# Patient Record
Sex: Male | Born: 1975 | Race: White | Hispanic: No | Marital: Single | Smoking: Current every day smoker
Health system: Southern US, Community
[De-identification: ages and names within clinical notes are randomized; demographics above are authoritative.]

## PROBLEM LIST (undated history)

## (undated) HISTORY — PX: FRACTURE SURGERY: SHX138

## (undated) HISTORY — PX: CHOLECYSTECTOMY: SHX55

---

## 2015-02-25 ENCOUNTER — Emergency Department (HOSPITAL_BASED_OUTPATIENT_CLINIC_OR_DEPARTMENT_OTHER)
Admission: EM | Admit: 2015-02-25 | Discharge: 2015-02-25 | Disposition: A | Discharge status: Home / Self Care | Payer: BLUE CROSS/BLUE SHIELD | Attending: Emergency Medicine | Admitting: Emergency Medicine

## 2015-02-25 ENCOUNTER — Encounter (HOSPITAL_BASED_OUTPATIENT_CLINIC_OR_DEPARTMENT_OTHER): Payer: Self-pay

## 2015-02-25 ENCOUNTER — Emergency Department (HOSPITAL_BASED_OUTPATIENT_CLINIC_OR_DEPARTMENT_OTHER): Payer: BLUE CROSS/BLUE SHIELD

## 2015-02-25 DIAGNOSIS — R110 Nausea: Secondary | ICD-10-CM | POA: Diagnosis not present

## 2015-02-25 DIAGNOSIS — R51 Headache: Secondary | ICD-10-CM | POA: Diagnosis not present

## 2015-02-25 DIAGNOSIS — R42 Dizziness and giddiness: Secondary | ICD-10-CM | POA: Insufficient documentation

## 2015-02-25 DIAGNOSIS — Z720 Tobacco use: Secondary | ICD-10-CM | POA: Insufficient documentation

## 2015-02-25 DIAGNOSIS — R519 Headache, unspecified: Secondary | ICD-10-CM

## 2015-02-25 LAB — COMPREHENSIVE METABOLIC PANEL
ALT: 47 U/L (ref 0–53)
ANION GAP: 7 (ref 5–15)
AST: 27 U/L (ref 0–37)
Albumin: 4.2 g/dL (ref 3.5–5.2)
Alkaline Phosphatase: 61 U/L (ref 39–117)
BUN: 14 mg/dL (ref 6–23)
CO2: 23 mmol/L (ref 19–32)
CREATININE: 1.15 mg/dL (ref 0.50–1.35)
Calcium: 9 mg/dL (ref 8.4–10.5)
Chloride: 108 mmol/L (ref 96–112)
GFR calc Af Amer: >90 mL/min (ref >90)
GFR calc non Af Amer: 80 mL/min — ABNORMAL LOW (ref >90)
Glucose, Bld: 112 mg/dL — ABNORMAL HIGH (ref 70–99)
Potassium: 3.9 mmol/L (ref 3.5–5.1)
SODIUM: 138 mmol/L (ref 135–145)
Total Bilirubin: 0.6 mg/dL (ref 0.3–1.2)
Total Protein: 7.1 g/dL (ref 6.0–8.3)

## 2015-02-25 LAB — CBC WITH DIFFERENTIAL/PLATELET
Basophils Absolute: 0 10*3/uL (ref 0.0–0.1)
Basophils Relative: 0 % (ref 0–1)
EOS PCT: 2 % (ref 0–5)
Eosinophils Absolute: 0.1 10*3/uL (ref 0.0–0.7)
HEMATOCRIT: 46.9 % (ref 39.0–52.0)
Hemoglobin: 16.6 g/dL (ref 13.0–17.0)
Lymphocytes Relative: 30 % (ref 12–46)
Lymphs Abs: 2 10*3/uL (ref 0.7–4.0)
MCH: 32 pg (ref 26.0–34.0)
MCHC: 35.4 g/dL (ref 30.0–36.0)
MCV: 90.4 fL (ref 78.0–100.0)
Monocytes Absolute: 0.6 10*3/uL (ref 0.1–1.0)
Monocytes Relative: 9 % (ref 3–12)
NEUTROS ABS: 3.9 10*3/uL (ref 1.7–7.7)
Neutrophils Relative %: 59 % (ref 43–77)
Platelets: 151 10*3/uL (ref 150–400)
RBC: 5.19 MIL/uL (ref 4.22–5.81)
RDW: 12.7 % (ref 11.5–15.5)
WBC: 6.6 10*3/uL (ref 4.0–10.5)

## 2015-02-25 MED ORDER — MECLIZINE HCL 25 MG PO TABS: 25.0000 mg | ORAL_TABLET | Freq: Four times a day (QID) | ORAL | Status: AC | PRN

## 2015-02-25 MED ORDER — IOHEXOL 350 MG/ML SOLN
100.0000 mL | Freq: Once | INTRAVENOUS | Status: AC | PRN
  Administered 2015-02-25: 100 mL via INTRAVENOUS

## 2015-02-25 MED ORDER — SODIUM CHLORIDE 0.9 % IV BOLUS (SEPSIS)
1000.0000 mL | Freq: Once | INTRAVENOUS | Status: AC
  Administered 2015-02-25: 1000 mL via INTRAVENOUS

## 2015-02-25 NOTE — ED Notes (Signed)
Pt returned from CT °

## 2015-02-25 NOTE — ED Notes (Signed)
MD at bedside. 

## 2015-02-25 NOTE — ED Notes (Signed)
Patient transported to CT 

## 2015-02-25 NOTE — ED Notes (Signed)
MD at bedside, discussing results with patient. 

## 2015-02-25 NOTE — ED Notes (Signed)
Reports dizziness off and on x 2 months. Reports intermittently happening before. Sts now it happens a few times a day. Reports "swimmy head" feeling. Sts occurred this morning, while he was driving. Reports quick movements make him dizzy and feel like he is spinning.

## 2015-02-25 NOTE — ED Provider Notes (Signed)
CSN: [id number]     Arrival date & time 02/25/15  1025 History   First MD Initiated Contact with Patient 02/25/15 1039     Chief Complaint  Patient presents with  . Dizziness     (Consider location/radiation/quality/duration/timing/severity/associated sxs/prior Treatment) HPI  39 year old male presents for evaluation of dizziness. He states that last night and today especially he had brief room spinning sensation. The first time it started when he was picking up something first child under the couch and when he stood up he felt acute onset of dizziness. When he rested the seem to go away. While he was driving he turned his head quickly and noticed that everything was spinning immediately after and he had to pull off the side of the road. When this occurs it feels like he's drunk and can't walk straight. Currently he is asymptomatic. He states he has not followed up with a doctor in several years and does not know of any medical problems. Patient states that this is happened infrequently in the past but over last 3 weeks is becoming more more frequent, almost daily. Mostly notices it when he lifts his head straight up. Denies any headache but states that for "a while" he's been having daily nausea in the morning that seems to progressively get better.  History reviewed. No pertinent past medical history. Past Surgical History  Procedure Laterality Date  . Fracture surgery    . Cholecystectomy     No family history on file. History  Substance Use Topics  . Smoking status: Current Every Day Smoker -- 0.50 packs/day    Types: Cigarettes  . Smokeless tobacco: Not on file  . Alcohol Use: No    Review of Systems  Constitutional: Negative for fever.  Eyes: Negative for photophobia and visual disturbance.  Gastrointestinal: Positive for nausea. Negative for vomiting.  Neurological: Positive for dizziness. Negative for weakness, numbness and headaches.  All other systems reviewed and are  negative.     Allergies  Review of patient's allergies indicates no known allergies.  Home Medications   Prior to Admission medications   Not on File   BP 141/90 mmHg  Pulse 84  Temp(Src) 98.3 F (36.8 C) (Oral)  Resp 18  Ht  (1.803 m)  Wt 213 lb 12.8 oz (96.979 kg)  BMI 29.83 kg/m2  SpO2 99% Physical Exam  Constitutional: He is oriented to person, place, and time. He appears well-developed and well-nourished.  HENT:  Head: Normocephalic and atraumatic.  Right Ear: External ear normal.  Left Ear: External ear normal.  Nose: Nose normal.  Mouth/Throat: Oropharynx is clear and moist.  Eyes: EOM are normal. Pupils are equal, round, and reactive to light. Right eye exhibits no discharge. Left eye exhibits no discharge.  No nystagmus  Neck: Normal range of motion. Neck supple.  Cardiovascular: Normal rate, regular rhythm, normal heart sounds and intact distal pulses.   Pulmonary/Chest: Effort normal and breath sounds normal.  Abdominal: Soft. He exhibits no distension. There is no tenderness.  Musculoskeletal: He exhibits no edema.  Neurological: He is alert and oriented to person, place, and time.  CN 2-12 grossly intact. 5/5 strength in all 4 extremities. Grossly normal sensation. Normal finger to nose. Normal RAM. Normal gait  Skin: Skin is warm and dry.  Nursing note and vitals reviewed.   ED Course  Procedures (including critical care time) Labs Review Labs Reviewed  COMPREHENSIVE METABOLIC PANEL - Abnormal; Notable for the following:    Glucose, Bld 112 (*)  GFR calc non Af Amer 80 (*)    All other components within normal limits  CBC WITH DIFFERENTIAL/PLATELET    Imaging Review Ct Angio Head W/cm &/or Wo Cm  02/25/2015   CLINICAL DATA:  39 year old male with headache, intermittent dizziness, nausea. Initial encounter. Questioned abnormality of the left ICA terminus on noncontrast head CT today.  EXAM: CT ANGIOGRAPHY HEAD  TECHNIQUE: Multidetector CT  imaging of the head was performed using the standard protocol during bolus administration of intravenous contrast. Multiplanar CT image reconstructions and MIPs were obtained to evaluate the vascular anatomy.  CONTRAST:  OMNIPAQUE IOHEXOL 350 MG/ML SOLN  COMPARISON:  Head CT without contrast 1148 hours today.  FINDINGS: Posterior circulation: Codominant distal vertebral arteries. Tortuous vertebrobasilar junction. PICA origins appear within normal limits. Tortuous basilar artery without stenosis. Normal SCA and PCA origins, tortuous proximal right PCA. Posterior communicating arteries are diminutive or absent. Bilateral PCA branches are within normal limits (early P2 segment branching on the right).  Anterior circulation: Suspect tortuous cervical right ICA just below the skullbase. Otherwise negative distal cervical ICAs. Both ICA siphons are patent. No siphon atherosclerosis or stenosis. Normal ophthalmic artery origins. Normal carotid termini. Normal MCA and ACA origins. Normal anterior communicating artery and bilateral ACA branches. Left MCA branches are within normal limits. Right MCA branches are within normal limits.  Venous sinuses: Normal.  Anatomic variants: Early branching of the right PCA P2 segment.  Delayed phase:Negative. No abnormal enhancement identified. Stable and normal gray-white matter differentiation.  Left maxillary sinus opacification/ mucous retention cyst.  IMPRESSION: Essentially normal intracranial CTA; occasional tortuosity of vessels. The left ICA terminus is normal.   Electronically Signed   By: Odessa Fleming M.D.   On: 02/25/2015 13:19   Ct Head Wo Contrast  02/25/2015   CLINICAL DATA:  Initial evaluation intermittent dizziness and nausea for several months becoming more frequent  EXAM: CT HEAD WITHOUT CONTRAST  TECHNIQUE: Contiguous axial images were obtained from the base of the skull through the vertex without intravenous contrast.  COMPARISON:  None.  FINDINGS: 2 cm mucous  retention cyst left maxillary sinus. Remainder of the visualized sinuses clear. Calvarium intact.  No evidence of mass or infarct. No hydrocephalus. No hemorrhage or extra-axial fluid. Mild prominence of the left supra clinoid intracranial internal carotid artery to 4 mm.  IMPRESSION: 1. Mild prominence left intracranial supra clinoid carotid artery. Cannot exclude aneurysm. Further evaluation with MRI/MRA recommended. 2. Left mucous retention cyst in the maxillary sinus   Electronically Signed   By: Esperanza Heir M.D.   On: 02/25/2015 12:13     EKG Interpretation   Date/Time:  Thursday February 25 2015 11:18:04 EDT Ventricular Rate:  72 PR Interval:  154 QRS Duration: 90 QT Interval:  368 QTC Calculation: 402 R Axis:   70 Text Interpretation:  Normal sinus rhythm Normal ECG No old tracing to  compare Confirmed by Vitoria Conyer  MD, Quilla Freeze ([id number]) on 02/25/2015 11:39:55 AM      MDM   Final diagnoses:  Headache  Vertigo    I believe the patient is experiencing vertigo, likely BPPV. Patient has no symptoms at this time. Has a normal neurologic exam, including cerebellar testing and gait. Patient has no nystagmus. Given his daily nausea in the morning and these persistent symptoms for months without any primary care, CT was obtained. Shows a possible abnormality concerning for possible aneurysm. Discussed with the radiologist who notes that CTA should be adequate to rule out aneurysm but would  not begin up for stroke. Given his age and proximal symptoms I'm not as concerned for stroke. CTA shows no aneurysm. I feel this point this is a benign etiology, discussed Epley maneuver as well as Antivert as needed. I did recommend he follow-up with a PCP to establish primary care and general health maintenance as well.    Pricilla LovelessScott Aariona Momon, MD 02/25/15 [phone]

## 2016-01-20 IMAGING — CT CT ANGIO HEAD
3 of 8 series · 18 of 47 positions shown · IV contrast (APPLIED)
Comparison: Head CT without contrast 6699 hours today.

CLINICAL DATA: 38-year-old male with headache, intermittent
dizziness, nausea. Initial encounter. Questioned abnormality of the
left ICA terminus on noncontrast head CT today.

EXAM:
CT ANGIOGRAPHY HEAD
TECHNIQUE: Multidetector CT imaging of the head was performed using the
standard protocol during bolus administration of intravenous
contrast. Multiplanar CT image reconstructions and MIPs were
obtained to evaluate the vascular anatomy.
CONTRAST:  100mL OMNIPAQUE IOHEXOL 350 MG/ML SOLN

[Series 9: cor thin · coronal · 0.34mm/px · 3 of 196 slices shown]
[im 56/196  brain]
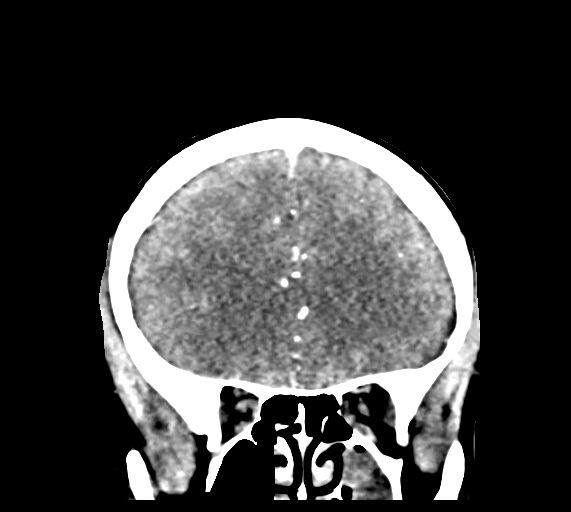
[im 84/196  brain]
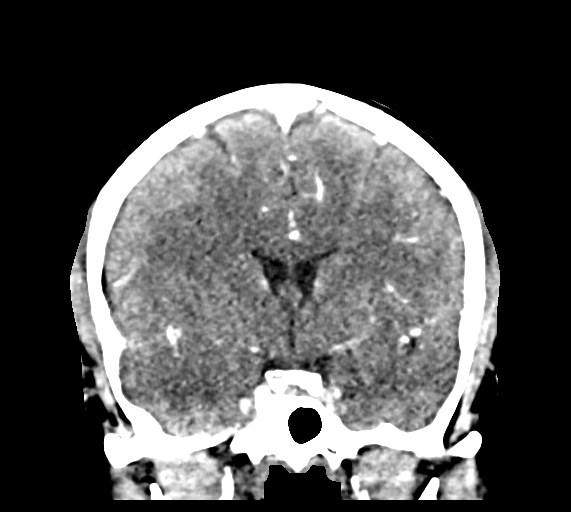
[im 112/196  brain]
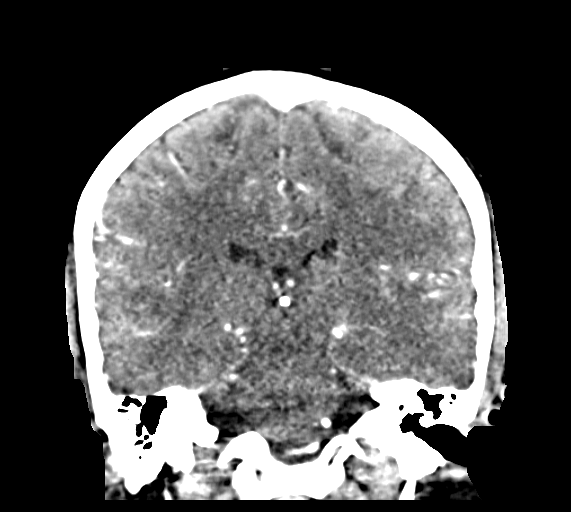

[Series 10: sag thin · sagittal · 0.36mm/px · 3 of 160 slices shown]
[im 32/160  brain]
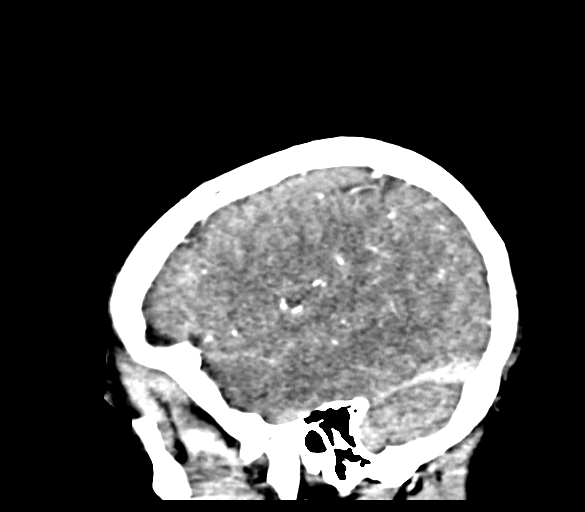
[im 64/160  brain]
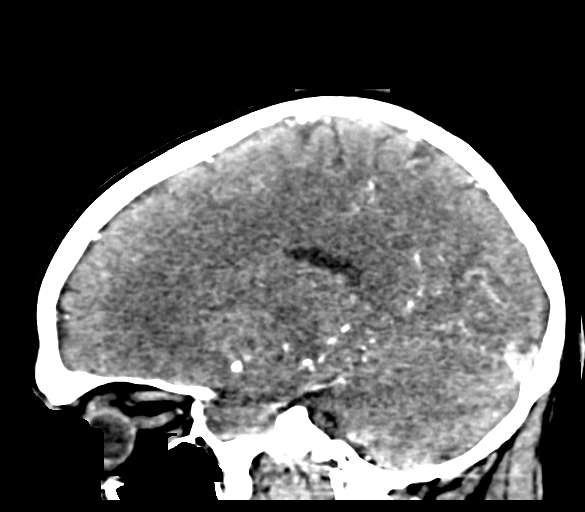
[im 96/160  brain]
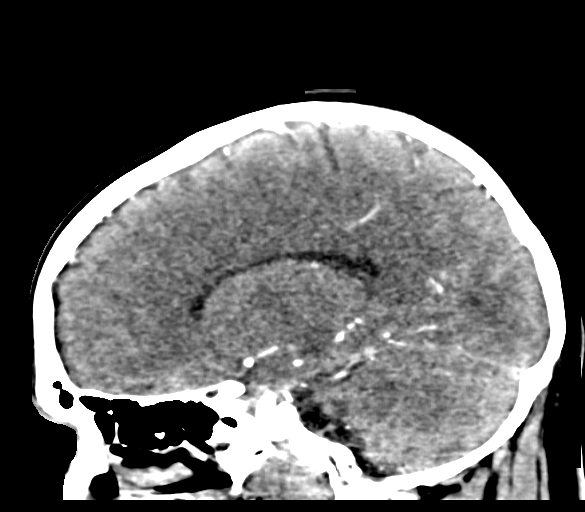

[Series 13: ax thin · axial · 0.42mm/px · z∈[-135,-9]mm · 12 of 146 slices shown]
[im 10/146  brain]
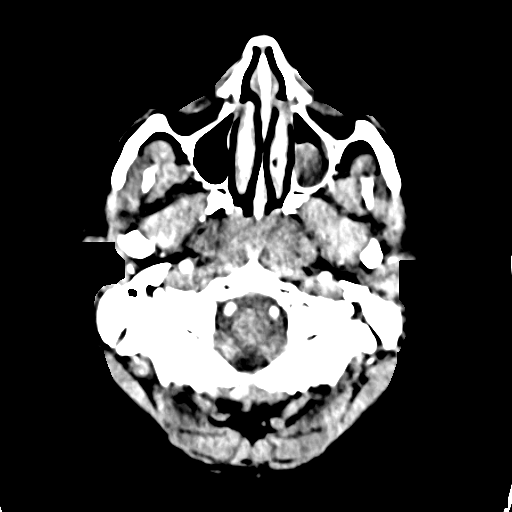
[im 20/146  bone]
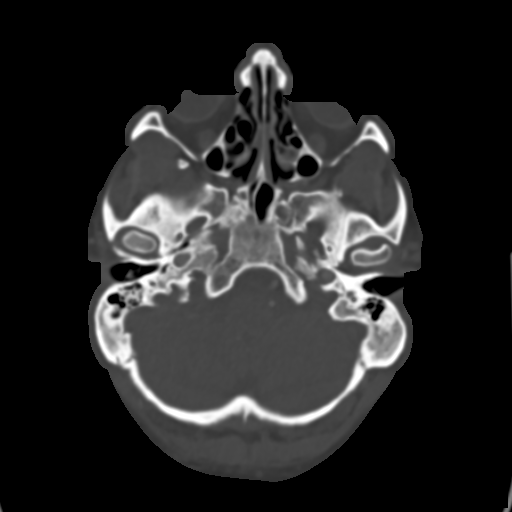
[im 30/146  brain]
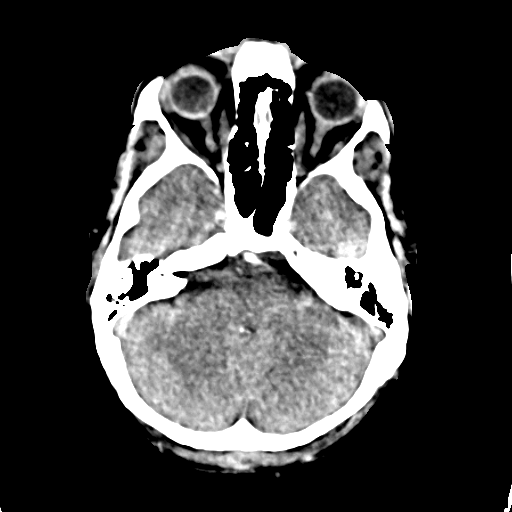
[im 49/146  bone]
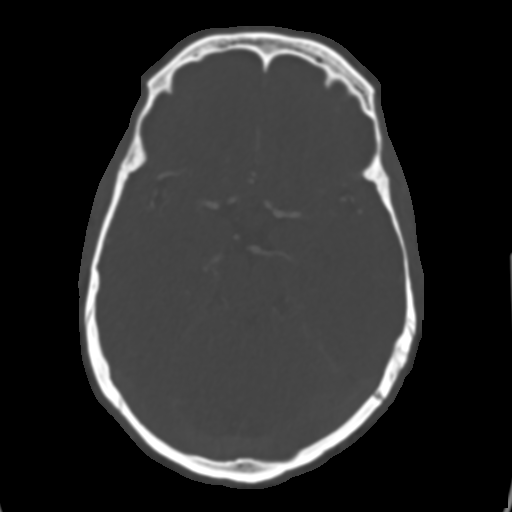
[im 59/146  brain]
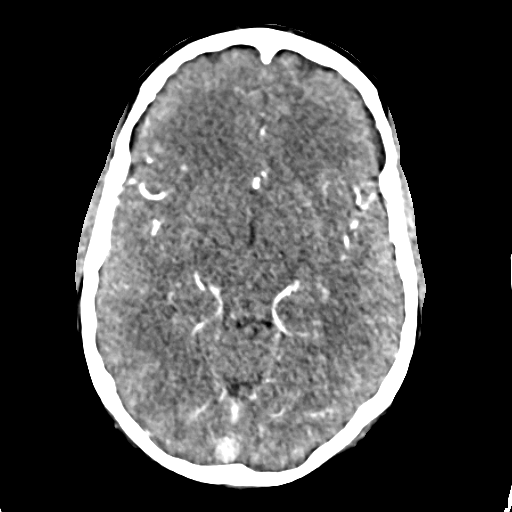
[im 68/146  bone]
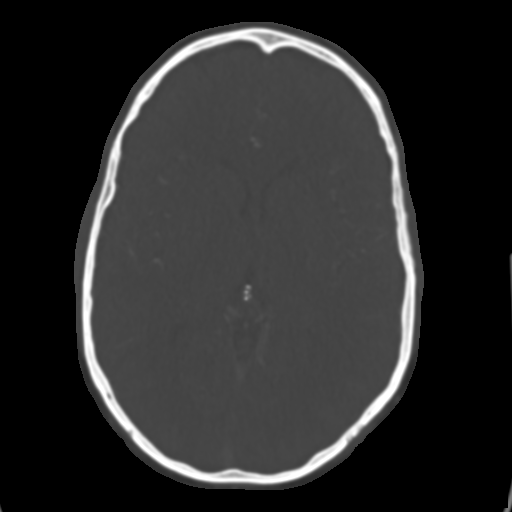
[im 78/146  brain]
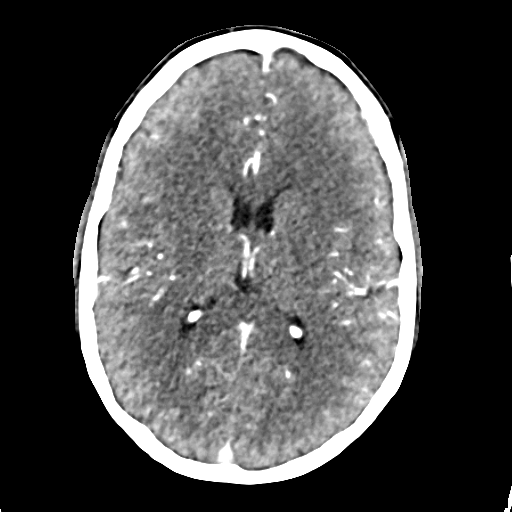
[im 88/146  bone]
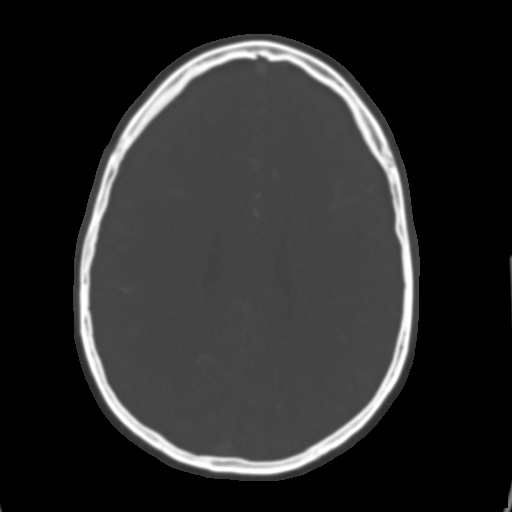
[im 97/146  brain]
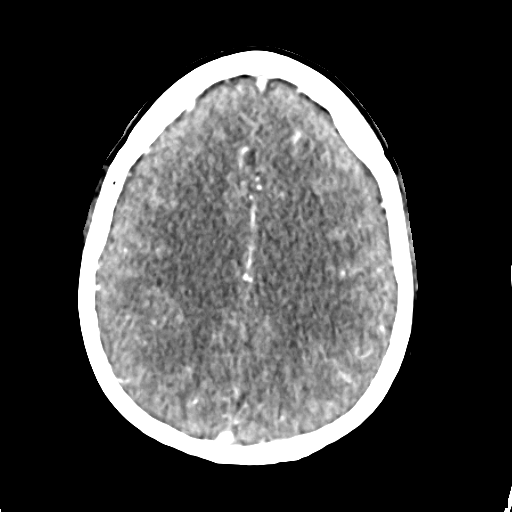
[im 117/146  bone]
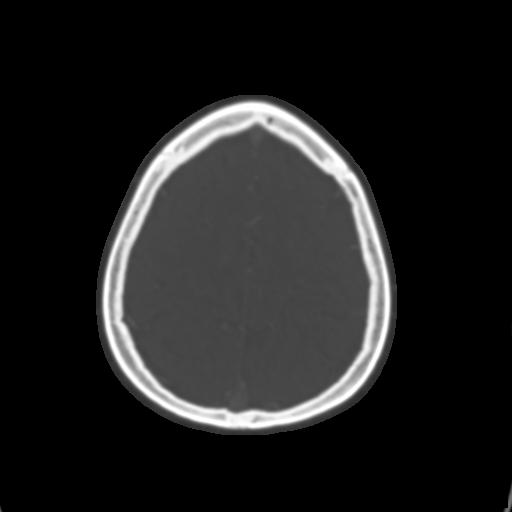
[im 126/146  brain]
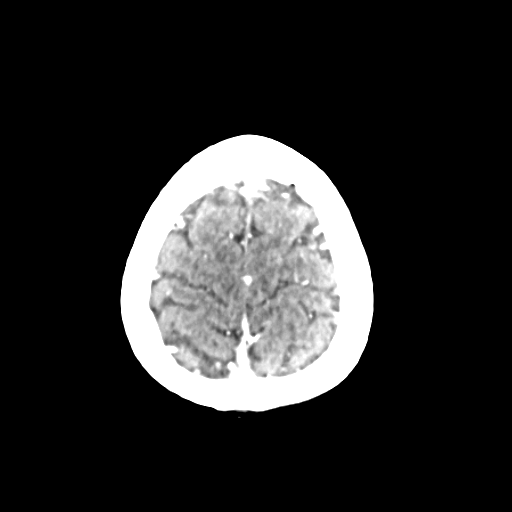
[im 136/146  bone]
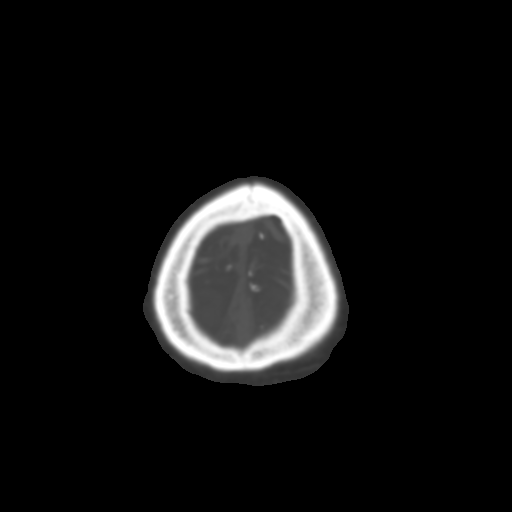

[18 of 47 positions shown; findings below may reference images not displayed]

FINDINGS: Posterior circulation: Codominant distal vertebral arteries.
Tortuous vertebrobasilar junction. PICA origins appear within normal
limits. Tortuous basilar artery without stenosis. Normal SCA and PCA
origins, tortuous proximal right PCA. Posterior communicating
arteries are diminutive or absent. Bilateral PCA branches are within
normal limits (early P2 segment branching on the right).

Anterior circulation: Suspect tortuous cervical right ICA just below
the skullbase. Otherwise negative distal cervical ICAs. Both ICA
siphons are patent. No siphon atherosclerosis or stenosis. Normal
ophthalmic artery origins. Normal carotid termini. Normal MCA and
ACA origins. Normal anterior communicating artery and bilateral ACA
branches. Left MCA branches are within normal limits. Right MCA
branches are within normal limits.

Venous sinuses: Normal.

Anatomic variants: Early branching of the right PCA P2 segment.

Delayed phase:Negative. No abnormal enhancement identified. Stable
and normal gray-white matter differentiation.

Left maxillary sinus opacification/ mucous retention cyst.
IMPRESSION: Essentially normal intracranial CTA; occasional tortuosity of
vessels. The left ICA terminus is normal.
